# Patient Record
Sex: Male | Born: 1985 | Hispanic: No | Marital: Married | Smoking: Current some day smoker
Health system: Southern US, Community
[De-identification: ages and names within clinical notes are randomized; demographics above are authoritative.]

## PROBLEM LIST (undated history)

## (undated) HISTORY — PX: CHOLECYSTECTOMY: SHX55

---

## 2020-11-08 ENCOUNTER — Ambulatory Visit
Admission: EM | Admit: 2020-11-08 | Discharge: 2020-11-08 | Disposition: A | Discharge status: Home / Self Care | Payer: BC Managed Care – PPO | Attending: Family Medicine | Admitting: Family Medicine

## 2020-11-08 ENCOUNTER — Encounter: Payer: Self-pay | Admitting: Emergency Medicine

## 2020-11-08 ENCOUNTER — Ambulatory Visit (INDEPENDENT_AMBULATORY_CARE_PROVIDER_SITE_OTHER): Payer: BC Managed Care – PPO

## 2020-11-08 DIAGNOSIS — S92911A Unspecified fracture of right toe(s), initial encounter for closed fracture: Secondary | ICD-10-CM

## 2020-11-08 DIAGNOSIS — M79674 Pain in right toe(s): Secondary | ICD-10-CM

## 2020-11-08 DIAGNOSIS — M7989 Other specified soft tissue disorders: Secondary | ICD-10-CM | POA: Diagnosis not present

## 2020-11-08 DIAGNOSIS — M79671 Pain in right foot: Secondary | ICD-10-CM

## 2020-11-08 MED ORDER — CYCLOBENZAPRINE HCL 10 MG PO TABS: 10.0000 mg | ORAL_TABLET | Freq: Two times a day (BID) | ORAL | 0 refills | Status: AC | PRN

## 2020-11-08 MED ORDER — TRAMADOL HCL 50 MG PO TABS: 50.0000 mg | ORAL_TABLET | Freq: Four times a day (QID) | ORAL | 0 refills | Status: AC | PRN

## 2020-11-08 MED ORDER — CRUTCHES-ALUMINUM MISC: 1.0000 | 0 refills | Status: AC | PRN

## 2020-11-08 NOTE — Discharge Instructions (Signed)
You've broken the bone in your right great toe and the foot  We have applied a postop shoe as well as given you crutches  Follow-up with orthopedics as soon as possible

## 2020-11-08 NOTE — ED Triage Notes (Signed)
Patient states wife stepped on foot last night.  Foot swollen and painful.

## 2020-11-08 NOTE — ED Provider Notes (Signed)
[facility]   [id number] 11/08/20 Arrival Time: 0820  JO:ACZYS PAIN  SUBJECTIVE: History from: patient. Kenneth Simpson is a 35 y.o. male complains of right great toe pain. Reports that he kicked some steps last night. Reports that the area is swollen and painful. Reports that it hurts to bear weight on it. Has tried OTC medications without relief.  Symptoms are made worse with activity.  Denies similar symptoms in the past.  Denies fever, chills, erythema, weakness, numbness and tingling, saddle paresthesias, loss of bowel or bladder function.      ROS: As per HPI.  All other pertinent ROS negative.     History reviewed. No pertinent past medical history. Past Surgical History:  Procedure Laterality Date  . CHOLECYSTECTOMY     Not on File No current facility-administered medications on file prior to encounter.   No current outpatient medications on file prior to encounter.   Social History   Socioeconomic History  . Marital status: Married    Spouse name: Not on file  . Number of children: Not on file  . Years of education: Not on file  . Highest education level: Not on file  Occupational History  . Not on file  Tobacco Use  . Smoking status: Current Some Day Smoker    Types: Cigars  . Smokeless tobacco: Never Used  Vaping Use  . Vaping Use: Never used  Substance and Sexual Activity  . Alcohol use: Yes    Comment: occassionlly  . Drug use: Yes    Frequency: 4.0 times per week    Types: Marijuana  . Sexual activity: Not on file  Other Topics Concern  . Not on file  Social History Narrative  . Not on file   Social Determinants of Health   Financial Resource Strain: Not on file  Food Insecurity: Not on file  Transportation Needs: Not on file  Physical Activity: Not on file  Stress: Not on file  Social Connections: Not on file  Intimate Partner Violence: Not on file   History reviewed. No pertinent family history.  OBJECTIVE:  Vitals:   11/08/20  0836 11/08/20 0851  BP: 127/75   Pulse: 69   Resp: 16   Temp: 98.4 F (36.9 C)   TempSrc: Oral   SpO2: 97%   Weight:  185 lb (83.9 kg)  Height:  5\' 10"  (1.778 m)    General appearance: ALERT; in no acute distress.  Head: NCAT Lungs: Normal respiratory effort CV: pulses 2+ bilaterally. Cap refill < 2 seconds Musculoskeletal:  Inspection: Skin warm, dry, clear and intact Erythema, effusion to right great toe Palpation: Right great toe tender to palpation ROM: Limited ROM active and passive to right great toe Skin: warm and dry Neurologic: Ambulates without difficulty; Sensation intact about the upper/ lower extremities Psychological: alert and cooperative; normal mood and affect  DIAGNOSTIC STUDIES:  DG Foot Complete Right  Result Date: 11/08/2020 CLINICAL DATA:  Right foot pain and swelling EXAM: RIGHT FOOT COMPLETE - 3+ VIEW COMPARISON:  None. FINDINGS: Three view radiograph right foot demonstrates a comminuted intra-articular fracture of the proximal phalanx of the great toe with fracture planes extending transversely through the mid-diaphysis as well as longitudinally into the distal articular surface with resultant mild articular incongruity. Normal overall alignment. No other fracture or dislocation. Remaining joint spaces are preserved. Soft tissues are unremarkable. IMPRESSION: Comminuted, intra-articular fracture of the proximal phalanx of the great toe with mild articular incongruity involving the articular surface of the interphalangeal  joint. Electronically Signed   By: Helyn Numbers MD   On: 11/08/2020 08:51     ASSESSMENT & PLAN:  1. Fracture of proximal phalanx of toe of right foot   2. Great toe pain, right      Meds ordered this encounter  Medications  . Misc. Devices (CRUTCHES-ALUMINUM) MISC    Sig: 1 each by Does not apply route as needed.    Dispense:  1 each    Refill:  0    Order Specific Question:   Supervising Provider    Answer:   Merrilee Jansky  [id number]  . traMADol (ULTRAM) 50 MG tablet    Sig: Take 1 tablet (50 mg total) by mouth every 6 (six) hours as needed.    Dispense:  15 tablet    Refill:  0    Order Specific Question:   Supervising Provider    Answer:   Merrilee Jansky [id number]  . cyclobenzaprine (FLEXERIL) 10 MG tablet    Sig: Take 1 tablet (10 mg total) by mouth 2 (two) times daily as needed for muscle spasms.    Dispense:  20 tablet    Refill:  0    Order Specific Question:   Supervising Provider    Answer:   Merrilee Jansky [id number]   Prescribed tramadol as needed for pain Take as directed Postop shoe applied in office Provided written prescription for crutches Prescribe cyclobenzaprine as needed for muscle spasms Work note provided Continue conservative management of rest, ice, and gentle stretches Take ibuprofen as needed for pain relief (may cause abdominal discomfort, ulcers, and GI bleeds avoid taking with other NSAIDs) Take cyclobenzaprine at nighttime for symptomatic relief. Avoid driving or operating heavy machinery while using medication. Follow up with PCP if symptoms persist Return or go to the ER if you have any new or worsening symptoms (fever, chills, chest pain, abdominal pain, changes in bowel or bladder habits, pain radiating into lower legs)   [place] Controlled Substances Registry consulted for this patient. I feel the risk/benefit ratio today is favorable for proceeding with this prescription for a controlled substance. Medication sedation precautions given.  Reviewed expectations re: course of current medical issues. Questions answered. Outlined signs and symptoms indicating need for more acute intervention. Patient verbalized understanding. After Visit Summary given.       Moshe Cipro, NP 11/08/20 [phone]

## 2020-11-09 ENCOUNTER — Other Ambulatory Visit: Payer: Self-pay

## 2020-11-09 ENCOUNTER — Encounter: Payer: Self-pay | Admitting: Orthopedic Surgery

## 2020-11-09 ENCOUNTER — Ambulatory Visit (INDEPENDENT_AMBULATORY_CARE_PROVIDER_SITE_OTHER): Payer: BC Managed Care – PPO | Admitting: Orthopedic Surgery

## 2020-11-09 VITALS — BP 131/86 | HR 72 | Ht 70.0 in | Wt 190.0 lb

## 2020-11-09 DIAGNOSIS — S92411A Displaced fracture of proximal phalanx of right great toe, initial encounter for closed fracture: Secondary | ICD-10-CM | POA: Diagnosis not present

## 2020-11-09 MED ORDER — HYDROCODONE-ACETAMINOPHEN 5-325 MG PO TABS: 1.0000 | ORAL_TABLET | Freq: Four times a day (QID) | ORAL | 0 refills | Status: AC | PRN

## 2020-11-09 NOTE — Patient Instructions (Signed)
Please provide letter for work - ok to return for light duty.  No heavy lifting.

## 2020-11-09 NOTE — Progress Notes (Signed)
New Patient Visit  Assessment: Kenneth Simpson is a 35 y.o. male with the following: Right great toe proximal phalanx fracture  Plan: Kenneth Simpson sustained an injury to his right great toe proximal phalanx approximately 2 days ago.  We reviewed the x-rays in clinic today, and I demonstrated the comminuted nature of his fracture.  There does appear to be a step-off in the IP joint.  Nonetheless, if this heals without further displacement, I believe his pain will improve.  He may develop some stiffness in the IP joint, but the patient states he is willing to accept this.  We will continue with nonoperative management.  He can be weightbearing as tolerated through his heel.  I have advised him to continue using the postop shoe.  He is not interested in a fracture boot.  I gave him a limited prescription of hydrocodone, and advised him to use this as needed, especially at the end of his workday.  He was given a work note to allow him to return to light duty only.  We will see him in 2 weeks for repeat evaluation.  All questions were answered and he is amenable to this plan.   Follow-up: Return in about 2 weeks (around 11/23/2020).  Subjective:  Chief Complaint  Patient presents with  . Foot Injury    11/07/20. Pt reports he was kicking some steps in his steel toe boot, Pt reports 5/10 today.    History of Present Illness: Kenneth Simpson is a 35 y.o. male who presents for evaluation of his right great toe.  He was wearing steel toed boots, and kicked a step, while playing with his family in his yard.  He noted immediate pain.  He was evaluated locally at an urgent care center, and noted to have a fracture of the great toe.  He lives close to [place], but is currently living in this area completing a short-term job.  He was given a prescription for tramadol, but this is not improving his pain.  He has been taking some ibuprofen otherwise.  No numbness or tingling.  He is unable to bear weight.  He notes  throbbing in the right foot, when he is on his feet for extended periods of time.  He also notes some throbbing, if it is elevated too high or too long.   Review of Systems: No fevers or chills No numbness or tingling No chest pain No shortness of breath No bowel or bladder dysfunction No GI distress No headaches   Medical History:  History reviewed. No pertinent past medical history.  Past Surgical History:  Procedure Laterality Date  . CHOLECYSTECTOMY      History reviewed. No pertinent family history. Social History   Tobacco Use  . Smoking status: Current Some Day Smoker    Types: Cigars  . Smokeless tobacco: Never Used  Vaping Use  . Vaping Use: Never used  Substance Use Topics  . Alcohol use: Yes    Comment: occassionlly  . Drug use: Yes    Frequency: 4.0 times per week    Types: Marijuana    No Known Allergies  Current Meds  Medication Sig  . cyclobenzaprine (FLEXERIL) 10 MG tablet Take 1 tablet (10 mg total) by mouth 2 (two) times daily as needed for muscle spasms.  . Misc. Devices (CRUTCHES-ALUMINUM) MISC 1 each by Does not apply route as needed.  . traMADol (ULTRAM) 50 MG tablet Take 1 tablet (50 mg total) by mouth every 6 (six) hours as  needed.    Objective: BP 131/86   Pulse 72   Ht 5\' 10"  (1.778 m)   Wt 190 lb (86.2 kg)   BMI 27.26 kg/m   Physical Exam:  General: Alert and oriented, no acute distress Gait: Ambulates with crutches  Evaluation the right foot demonstrates no deformity.  There is no open lesions.  He has significant swelling over the great toe, with ecchymosis spreading over the dorsum of the foot.  Tenderness to palpation of the great toe.  Range of motion deferred due to the known fracture.  Toes are warm and well perfused.    IMAGING: I personally reviewed images previously obtained from the ED   X-ray of the right foot demonstrates comminuted fracture of the proximal phalanx to the great toe.  There is some  intra-articular involvement at the IP joint, with a slight step-off noted.   New Medications:  No orders of the defined types were placed in this encounter.     , MD  11/09/2020 12:39 PM

## 2020-11-23 ENCOUNTER — Ambulatory Visit: Payer: BC Managed Care – PPO | Admitting: Orthopedic Surgery

## 2020-11-23 DIAGNOSIS — S92414D Nondisplaced fracture of proximal phalanx of right great toe, subsequent encounter for fracture with routine healing: Secondary | ICD-10-CM

## 2021-11-12 IMAGING — DX DG FOOT COMPLETE 3+V*R*
3 series · 3 of 3 positions shown · non-contrast
Comparison: None.

CLINICAL DATA: Right foot pain and swelling

EXAM:
RIGHT FOOT COMPLETE - 3+ VIEW

[foot ap]
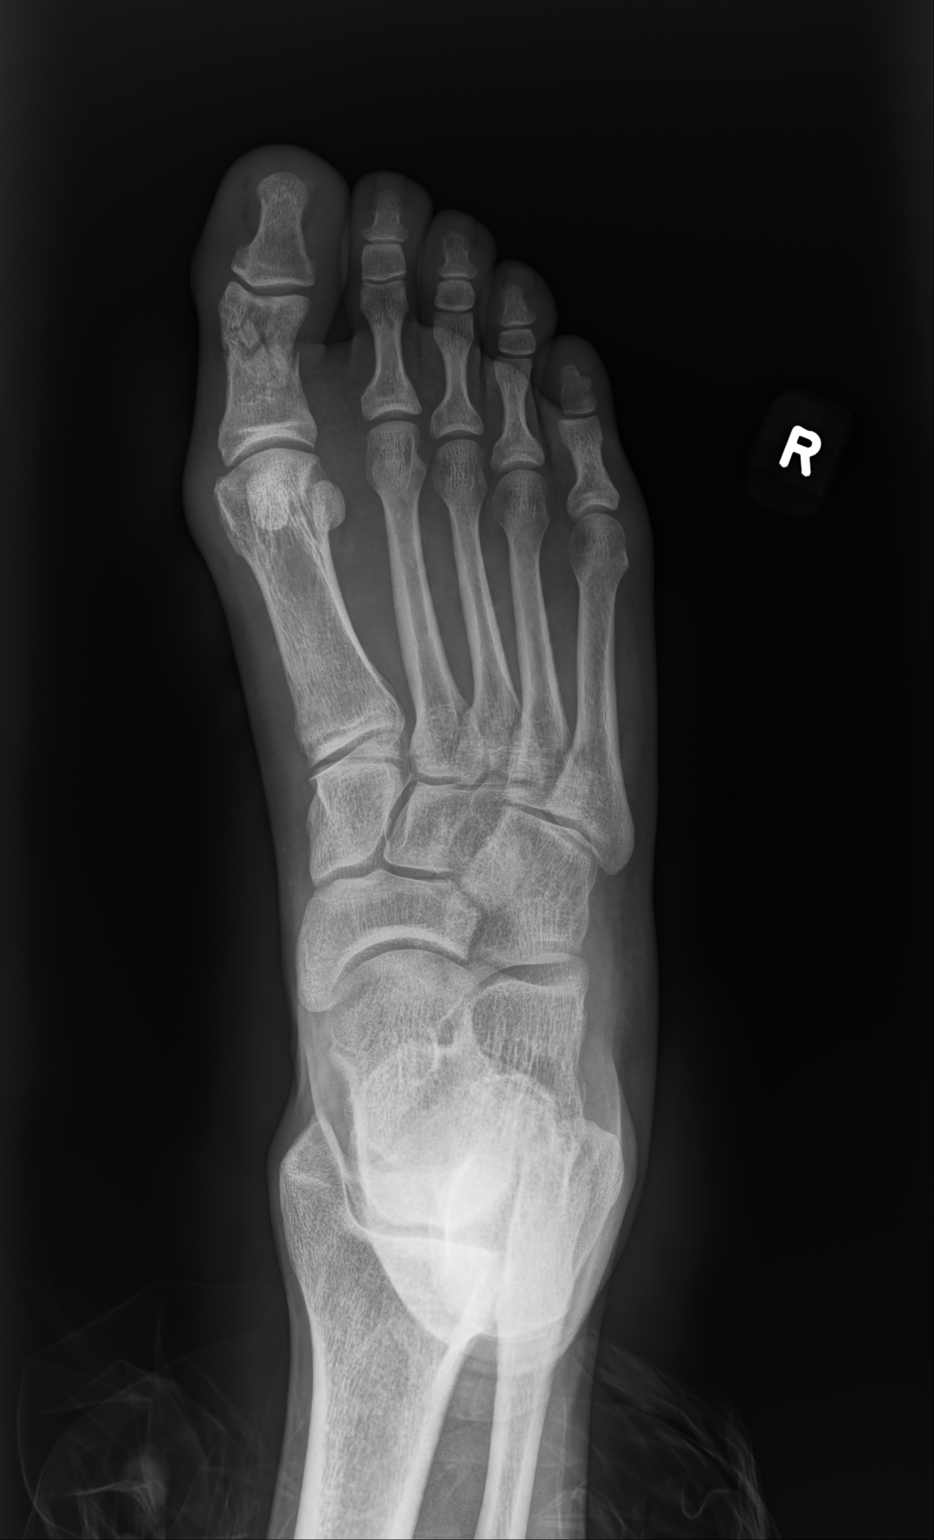

[foot mlo]
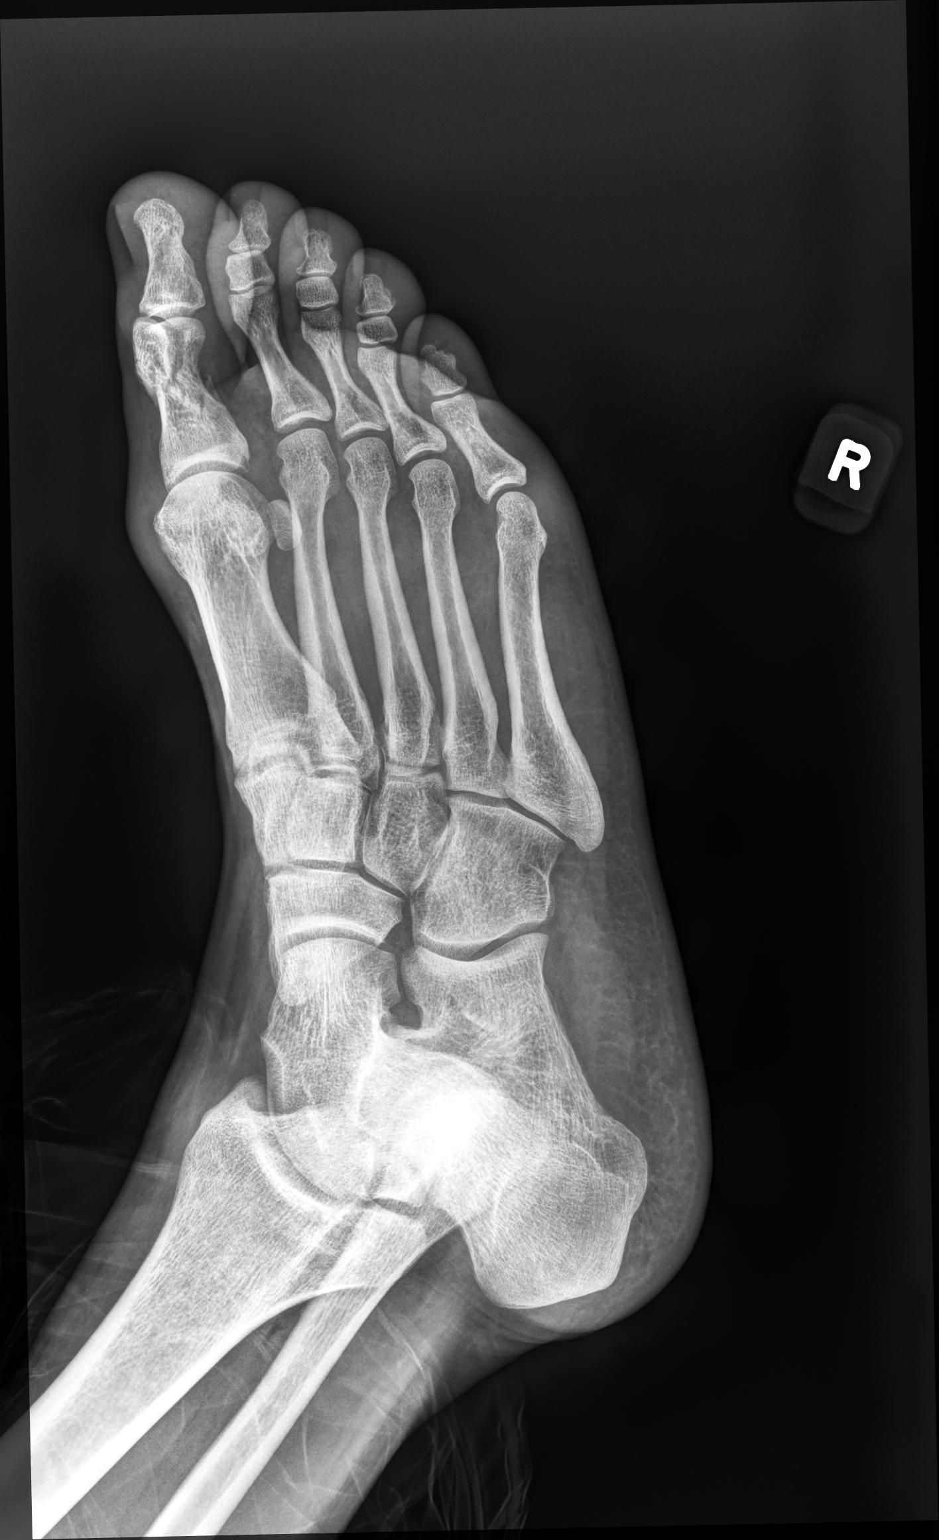

[foot lat]
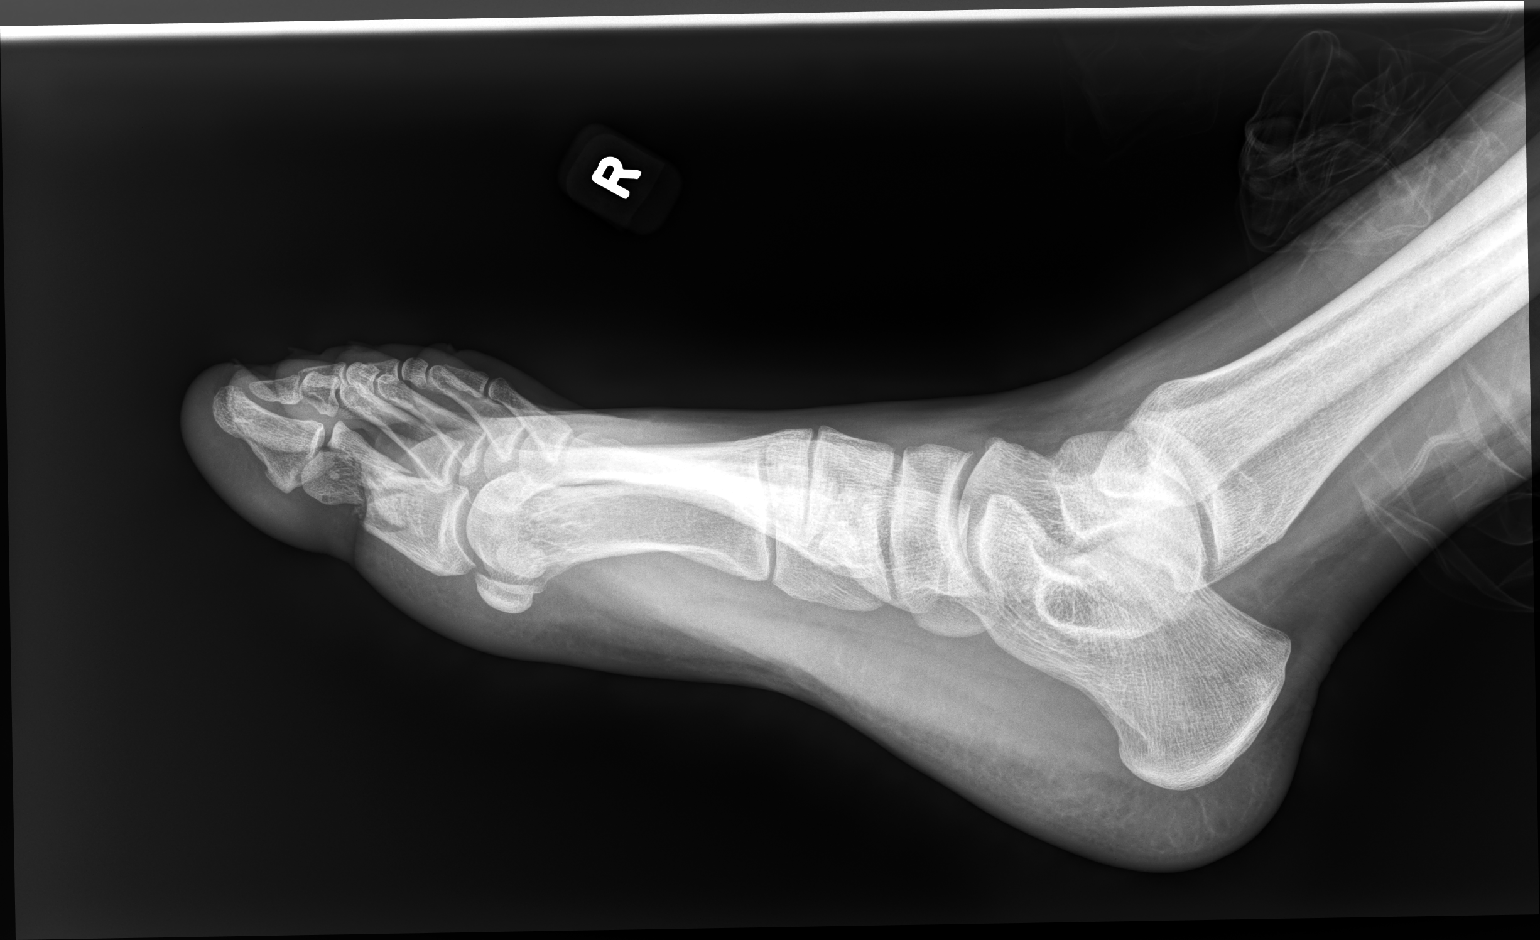

[3 of 3 positions shown; findings below may reference images not displayed]

FINDINGS: Three view radiograph right foot demonstrates a comminuted
intra-articular fracture of the proximal phalanx of the great toe
with fracture planes extending transversely through the
mid-diaphysis as well as longitudinally into the distal articular
surface with resultant mild articular incongruity. Normal overall
alignment. No other fracture or dislocation. Remaining joint spaces
are preserved. Soft tissues are unremarkable.
IMPRESSION: Comminuted, intra-articular fracture of the proximal phalanx of the
great toe with mild articular incongruity involving the articular
surface of the interphalangeal joint.
